# Patient Record
Sex: Female | Born: 1950 | Race: Asian | Hispanic: No | Marital: Married
Health system: Southern US, Community
[De-identification: ages and names within clinical notes are randomized; demographics above are authoritative.]

---

## 2008-07-09 ENCOUNTER — Emergency Department (HOSPITAL_BASED_OUTPATIENT_CLINIC_OR_DEPARTMENT_OTHER): Admission: EM | Admit: 2008-07-09 | Discharge: 2008-07-09 | Payer: Self-pay | Admitting: Emergency Medicine

## 2010-01-27 ENCOUNTER — Encounter: Payer: Self-pay | Admitting: Obstetrics and Gynecology

## 2010-04-14 LAB — URINALYSIS, ROUTINE W REFLEX MICROSCOPIC
Bilirubin Urine: NEGATIVE
Glucose, UA: NEGATIVE mg/dL
Ketones, ur: NEGATIVE mg/dL
Specific Gravity, Urine: 1.017 (ref 1.005–1.030)
pH: 6 (ref 5.0–8.0)

## 2010-04-14 LAB — URINE MICROSCOPIC-ADD ON

## 2015-08-22 ENCOUNTER — Emergency Department (HOSPITAL_COMMUNITY): Payer: Medicare Other

## 2015-08-22 ENCOUNTER — Emergency Department (HOSPITAL_COMMUNITY)
Admission: EM | Admit: 2015-08-22 | Discharge: 2015-08-22 | Disposition: A | Discharge status: Home / Self Care | Payer: Medicare Other | Attending: Emergency Medicine | Admitting: Emergency Medicine

## 2015-08-22 DIAGNOSIS — R079 Chest pain, unspecified: Secondary | ICD-10-CM | POA: Diagnosis present

## 2015-08-22 DIAGNOSIS — R51 Headache: Secondary | ICD-10-CM | POA: Diagnosis not present

## 2015-08-22 DIAGNOSIS — N390 Urinary tract infection, site not specified: Secondary | ICD-10-CM | POA: Diagnosis not present

## 2015-08-22 DIAGNOSIS — R519 Headache, unspecified: Secondary | ICD-10-CM

## 2015-08-22 LAB — URINALYSIS, ROUTINE W REFLEX MICROSCOPIC
Bilirubin Urine: NEGATIVE
Glucose, UA: NEGATIVE mg/dL
Hgb urine dipstick: NEGATIVE
Ketones, ur: NEGATIVE mg/dL
Nitrite: POSITIVE — AB
Protein, ur: NEGATIVE mg/dL
Specific Gravity, Urine: 1.011 (ref 1.005–1.030)
pH: 7 (ref 5.0–8.0)

## 2015-08-22 LAB — HEPATIC FUNCTION PANEL
ALT: 23 U/L (ref 14–54)
AST: 25 U/L (ref 15–41)
Albumin: 3.5 g/dL (ref 3.5–5.0)
Alkaline Phosphatase: 46 U/L (ref 38–126)
Bilirubin, Direct: 0.2 mg/dL (ref 0.1–0.5)
Indirect Bilirubin: 0.9 mg/dL (ref 0.3–0.9)
Total Bilirubin: 1.1 mg/dL (ref 0.3–1.2)
Total Protein: 6.4 g/dL — ABNORMAL LOW (ref 6.5–8.1)

## 2015-08-22 LAB — CBC
HCT: 40.1 % (ref 36.0–46.0)
Hemoglobin: 13.4 g/dL (ref 12.0–15.0)
MCH: 30.9 pg (ref 26.0–34.0)
MCHC: 33.4 g/dL (ref 30.0–36.0)
MCV: 92.4 fL (ref 78.0–100.0)
Platelets: 241 10*3/uL (ref 150–400)
RBC: 4.34 MIL/uL (ref 3.87–5.11)
RDW: 13.7 % (ref 11.5–15.5)
WBC: 6.4 10*3/uL (ref 4.0–10.5)

## 2015-08-22 LAB — URINE MICROSCOPIC-ADD ON
RBC / HPF: NONE SEEN RBC/hpf (ref 0–5)
Squamous Epithelial / LPF: NONE SEEN

## 2015-08-22 LAB — BASIC METABOLIC PANEL
Anion gap: 7 (ref 5–15)
BUN: 14 mg/dL (ref 6–20)
CO2: 21 mmol/L — ABNORMAL LOW (ref 22–32)
Calcium: 9 mg/dL (ref 8.9–10.3)
Chloride: 108 mmol/L (ref 101–111)
Creatinine, Ser: 0.77 mg/dL (ref 0.44–1.00)
GFR calc Af Amer: >60 mL/min (ref >60)
GFR calc non Af Amer: >60 mL/min (ref >60)
Glucose, Bld: 111 mg/dL — ABNORMAL HIGH (ref 65–99)
Potassium: 3.4 mmol/L — ABNORMAL LOW (ref 3.5–5.1)
Sodium: 136 mmol/L (ref 135–145)

## 2015-08-22 LAB — I-STAT TROPONIN, ED: Troponin i, poc: 0.01 ng/mL (ref 0.00–0.08)

## 2015-08-22 LAB — LIPASE, BLOOD: Lipase: 38 U/L (ref 11–51)

## 2015-08-22 MED ORDER — DIPHENHYDRAMINE HCL 50 MG/ML IJ SOLN
12.5000 mg | Freq: Once | INTRAMUSCULAR | Status: AC
  Administered 2015-08-22: 09:00:00 via INTRAVENOUS
  Filled 2015-08-22: qty 1

## 2015-08-22 MED ORDER — KETOROLAC TROMETHAMINE 15 MG/ML IJ SOLN
15.0000 mg | Freq: Once | INTRAMUSCULAR | Status: AC
  Administered 2015-08-22: 15 mg via INTRAVENOUS
  Filled 2015-08-22: qty 1

## 2015-08-22 MED ORDER — SODIUM CHLORIDE 0.9 % IV BOLUS (SEPSIS)
1000.0000 mL | Freq: Once | INTRAVENOUS | Status: AC
  Administered 2015-08-22: 1000 mL via INTRAVENOUS

## 2015-08-22 MED ORDER — CEPHALEXIN 500 MG PO CAPS: 500.0000 mg | ORAL_CAPSULE | Freq: Three times a day (TID) | ORAL | 0 refills | Status: AC

## 2015-08-22 MED ORDER — PROCHLORPERAZINE EDISYLATE 5 MG/ML IJ SOLN
5.0000 mg | Freq: Once | INTRAMUSCULAR | Status: AC
  Administered 2015-08-22: 09:00:00 via INTRAVENOUS
  Filled 2015-08-22: qty 2

## 2015-08-22 MED ORDER — DEXTROSE 5 % IV SOLN
1.0000 g | Freq: Once | INTRAVENOUS | Status: AC
  Administered 2015-08-22: 1 g via INTRAVENOUS
  Filled 2015-08-22: qty 10

## 2015-08-22 NOTE — ED Notes (Signed)
Pt taken to CT.

## 2015-08-22 NOTE — ED Triage Notes (Signed)
[facility] with chest pain, HA and weakness that reportedly started at 10pm last night. CP was central, tight and non-radiating. EMS gave 324mg  ASA and 3 NTG tabs with reported pain relief. When roomed, pt states "i have weakness all over, maybe I had a stroke". Pt very anxious, tearful, [place] speaking - son translating. Hx of MI 3 mo's ago

## 2015-08-22 NOTE — ED Provider Notes (Signed)
[facility]-EMERGENCY DEPT Provider Note   CSN: [id number] Arrival date & time: 08/22/15  [id number]     History   Chief Complaint Chief Complaint  Patient presents with  . Chest Pain    HPI Taylor Landry is a 65 y.o. female.  HPI   64yF with multiple complaints. Primarily [place]namese speaking but does understand some English and family at bedside proving translation. Primary concern seems to be headache. Onset last night around 7-8pm. Gradual onset. Denies trauma. Persistent since then. L sided and throbbing/pounding. Has headaches like this previously. Per review of records, she [facility] for HA. No appreciable exacerbating or relieving factors. No fever or chills.   She has a litany of other complaints on ROS. She endorses CP which began yesterday as well and was persistent until prior to arrival when given nitro and ASA. No respiratory complaints. She reports previously told that she had a heart attack but I cannot substantiate this on records I reviewed and she cannot recall who told her this. She denies ever having cardiac catheterization. NO unusual leg pain or swelling.   R lower back pain which radiates into RLQ. Recently diagnosed with UTI. She reports started on abx by GI provider than changes to another abx by PCP. She cannot recall abx but she says she is currently still taking them. She says her pain and urinary burning have not improved despite them though.   She feels weak all over and concerned that she may be having a stroke. She denies any focal neurological deficits.   No past medical history on file.  There are no active problems to display for this patient.   No past surgical history on file.  OB History    No data available       Home Medications    Prior to Admission medications   Medication Sig Start Date End Date Taking? Authorizing Provider  atorvastatin (LIPITOR) 40 MG tablet Take 40 mg by mouth daily.   Yes  Historical Provider, MD  losartan-hydrochlorothiazide (HYZAAR) 100-25 MG tablet Take 1 tablet by mouth daily.   Yes Historical Provider, MD  sulfamethoxazole-trimethoprim (BACTRIM DS,SEPTRA DS) 800-160 MG tablet Take 1 tablet by mouth daily.   Yes Historical Provider, MD    Family History No family history on file.  Social History Social History  Substance Use Topics  . Smoking status: Not on file  . Smokeless tobacco: Not on file  . Alcohol use Not on file     Allergies   Review of patient's allergies indicates no known allergies.   Review of Systems Review of Systems  All systems reviewed and negative, other than as noted in HPI.  Physical Exam Updated Vital Signs BP 153/86   Pulse 62   Temp 98.1 F (36.7 C) (Oral)   Resp 18   SpO2 99%   Physical Exam  Constitutional: She appears well-developed and well-nourished. No distress.  HENT:  Head: Normocephalic and atraumatic.  Eyes: Conjunctivae and EOM are normal. Pupils are equal, round, and reactive to light.  Neck: Neck supple.  No nuchal rigidity  Cardiovascular: Normal rate and regular rhythm.   No murmur heard. Pulmonary/Chest: Effort normal and breath sounds normal. No respiratory distress.  Abdominal: Soft. There is no tenderness.  Musculoskeletal: She exhibits no edema.  Neurological: She is alert. No cranial nerve deficit. She exhibits normal muscle tone. Coordination normal.  Skin: Skin is warm and dry. No rash noted.  Psychiatric: She has a normal mood and affect.  Nursing note and vitals reviewed.    ED Treatments / Results  Labs (all labs ordered are listed, but only abnormal results are displayed) Labs Reviewed  URINALYSIS, ROUTINE W REFLEX MICROSCOPIC (NOT AT [facility]) - Abnormal; Notable for the following:       Result Value   APPearance CLOUDY (*)    Nitrite POSITIVE (*)    Leukocytes, UA TRACE (*)    All other components within normal limits  HEPATIC FUNCTION PANEL - Abnormal; Notable for  the following:    Total Protein 6.4 (*)    All other components within normal limits  BASIC METABOLIC PANEL - Abnormal; Notable for the following:    Potassium 3.4 (*)    CO2 21 (*)    Glucose, Bld 111 (*)    All other components within normal limits  URINE MICROSCOPIC-ADD ON - Abnormal; Notable for the following:    Bacteria, UA MANY (*)    All other components within normal limits  URINE CULTURE  CBC  LIPASE, BLOOD  I-STAT TROPOININ, ED    EKG  EKG Interpretation  Date/Time:  Wednesday August 22 2015 06:59:16 EDT Ventricular Rate:  87 PR Interval:  166 QRS Duration: 106 QT Interval:  420 QTC Calculation: 505 R Axis:   -2 Text Interpretation:  Normal sinus rhythm Incomplete right bundle branch block Prolonged QT Abnormal ECG No old tracing to compare Confirmed by WARD,  DO, KRISTEN ([id number]) on 08/22/2015 7:24:37 AM       Radiology Dg Chest 2 View  Result Date: 08/22/2015 CLINICAL DATA:  Chest pain and dizziness EXAM: CHEST  2 VIEW COMPARISON:  None available FINDINGS: Upper limits of normal heart size. Negative mediastinal contours. EKG leads create artifact over the chest. No acute infiltrate or edema. No effusion or pneumothorax. No acute osseous findings. IMPRESSION: No active cardiopulmonary disease. Electronically Signed   By: Marnee Spring M.D.   On: 08/22/2015 08:13   Ct Head Wo Contrast  Result Date: 08/22/2015 CLINICAL DATA:  Headaches and right-sided weakness for 2 days EXAM: CT HEAD WITHOUT CONTRAST TECHNIQUE: Contiguous axial images were obtained from the base of the skull through the vertex without intravenous contrast. COMPARISON:  None. FINDINGS: Brain: No evidence of acute infarction, hemorrhage, hydrocephalus, extra-axial collection or mass lesion/mass effect. Vascular: No hyperdense vessel or unexpected calcification. Skull: Within normal limits Sinuses/Orbits: No acute finding. IMPRESSION: No acute intracranial abnormality noted. Electronically Signed   By:  Alcide Clever M.D.   On: 08/22/2015 09:11    Procedures Procedures (including critical care time)  Medications Ordered in ED Medications  ketorolac (TORADOL) 15 MG/ML injection 15 mg (not administered)  cefTRIAXone (ROCEPHIN) 1 g in dextrose 5 % 50 mL IVPB (not administered)  sodium chloride 0.9 % bolus 1,000 mL (0 mLs Intravenous Stopped 08/22/15 0940)  prochlorperazine (COMPAZINE) injection 5 mg ( Intravenous Given 08/22/15 0835)  diphenhydrAMINE (BENADRYL) injection 12.5 mg ( Intravenous Given 08/22/15 0835)     Initial Impression / Assessment and Plan / ED Course  I have reviewed the triage vital signs and the nursing notes.  Pertinent labs & imaging results that were available during my care of the patient were reviewed by me and considered in my medical decision making (see chart for details).  Clinical Course    64yF with multiple complaints. Some seem chronic to some degree. Has had headaches intermittently dating back over a year per review of records. No particualarly concerning red flags  today. Afebrile. No nuchal rigidity. No trauma. She seems generally weak on exam and I question how much effort related. No focally worse weakness. No blood thinners. No known malignancy. No visual complaints.   R mid/lower back and abdominal pain may be from UTI. Many bacteria on UA today w/o squamous cells. Nitrite +, Trace leukocytes. Unfortunately I cannot locate any culture data from recent visits to outside facilities. Will send one today. I'm also not sure what antibiotics she specifically have been on. Will give dose of rocephin in ED and DC with keflex. This could potentially explain her generalized weakness/malaise as well. She doesn't describe symptoms which would make me have a stronger suspicion for ureteral colic/infected stone.  Her CP is atypical. Now resolved. Was constant from last night up until just before arrival. EKG w/o acute appearing changes. Troponin normal. Doubt ACS, PE,  dissection or other emergent process.     Final Clinical Impressions(s) / ED Diagnoses   Final diagnoses:  Nonintractable headache, unspecified chronicity pattern, unspecified headache type  Chest pain, unspecified chest pain type  UTI (lower urinary tract infection)    New Prescriptions New Prescriptions   No medications on file     Raeford RazorStephen Elsa Ploch, MD 08/26/15 [phone]

## 2015-08-24 LAB — URINE CULTURE: Culture: >=100000 — AB

## 2015-08-25 ENCOUNTER — Telehealth (HOSPITAL_BASED_OUTPATIENT_CLINIC_OR_DEPARTMENT_OTHER): Payer: Self-pay

## 2015-08-25 NOTE — Telephone Encounter (Signed)
Post ED Visit - Positive Culture Follow-up  Culture report reviewed by antimicrobial stewardship pharmacist:  []  Taylor Landry, Pharm.D. []  Taylor Landry, Pharm.D., BCPS []  Taylor Landry, Pharm.D. []  Taylor Landry, Pharm.D., BCPS []  Taylor Landry, [place] BCPS, AAHIVP []  Taylor Landry, Pharm.D., BCPS, AAHIVP []  Taylor Landry, [address] []  Taylor Landry, [address]Ph[address] Rachel Rumsbarger Pharm D Positive urine culture Treated with Cephalexin, organism sensitive to the same and no further patient follow-up is required at this time.  Taylor Landry, Taylor Landry 08/25/2015, 11:28 AM

## 2016-06-06 DEATH — deceased

## 2017-02-17 IMAGING — DX DG CHEST 2V
2 series · 2 of 2 positions shown · non-contrast
Comparison: None available

CLINICAL DATA: Chest pain and dizziness

EXAM:
CHEST  2 VIEW

[w chest pa]
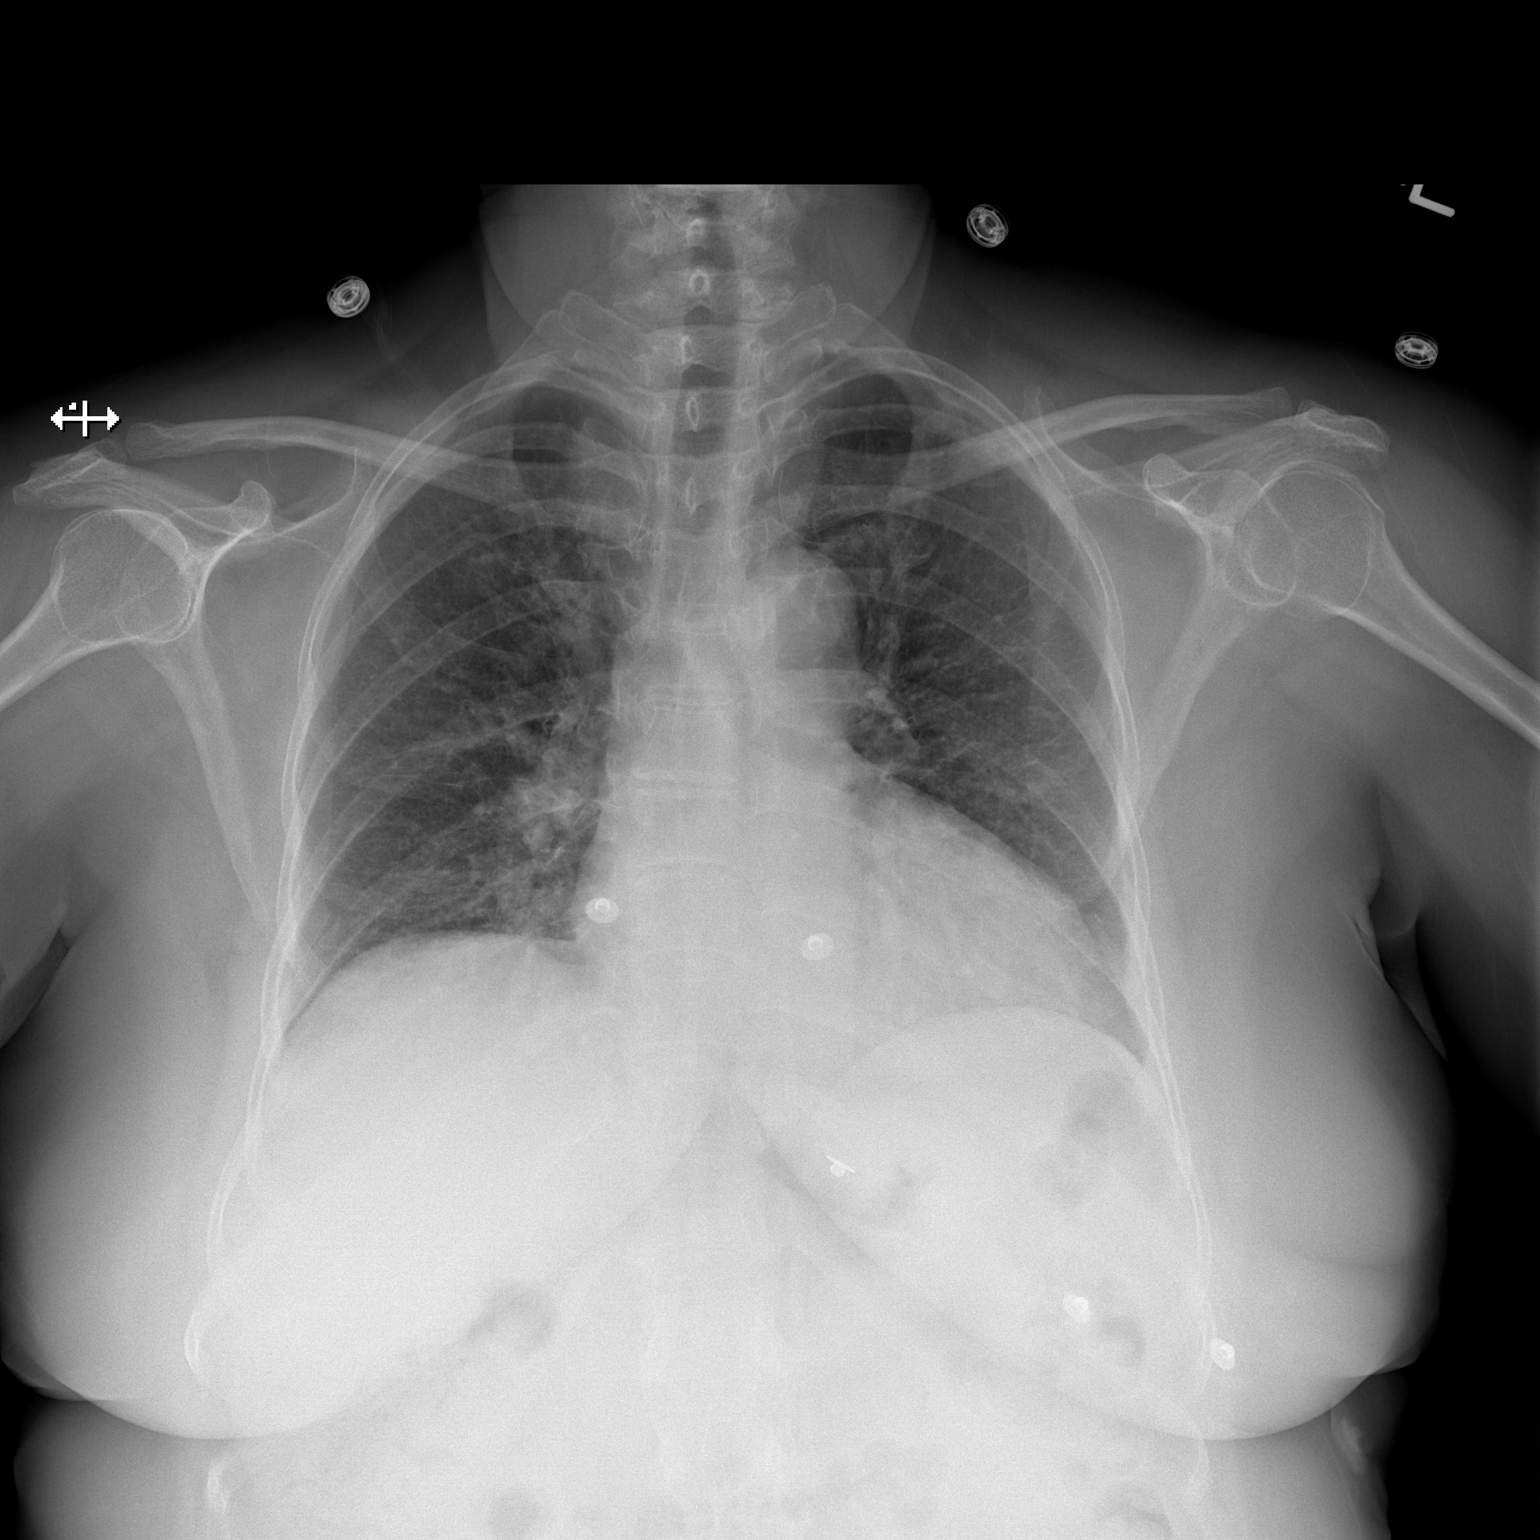

[w chest lat]
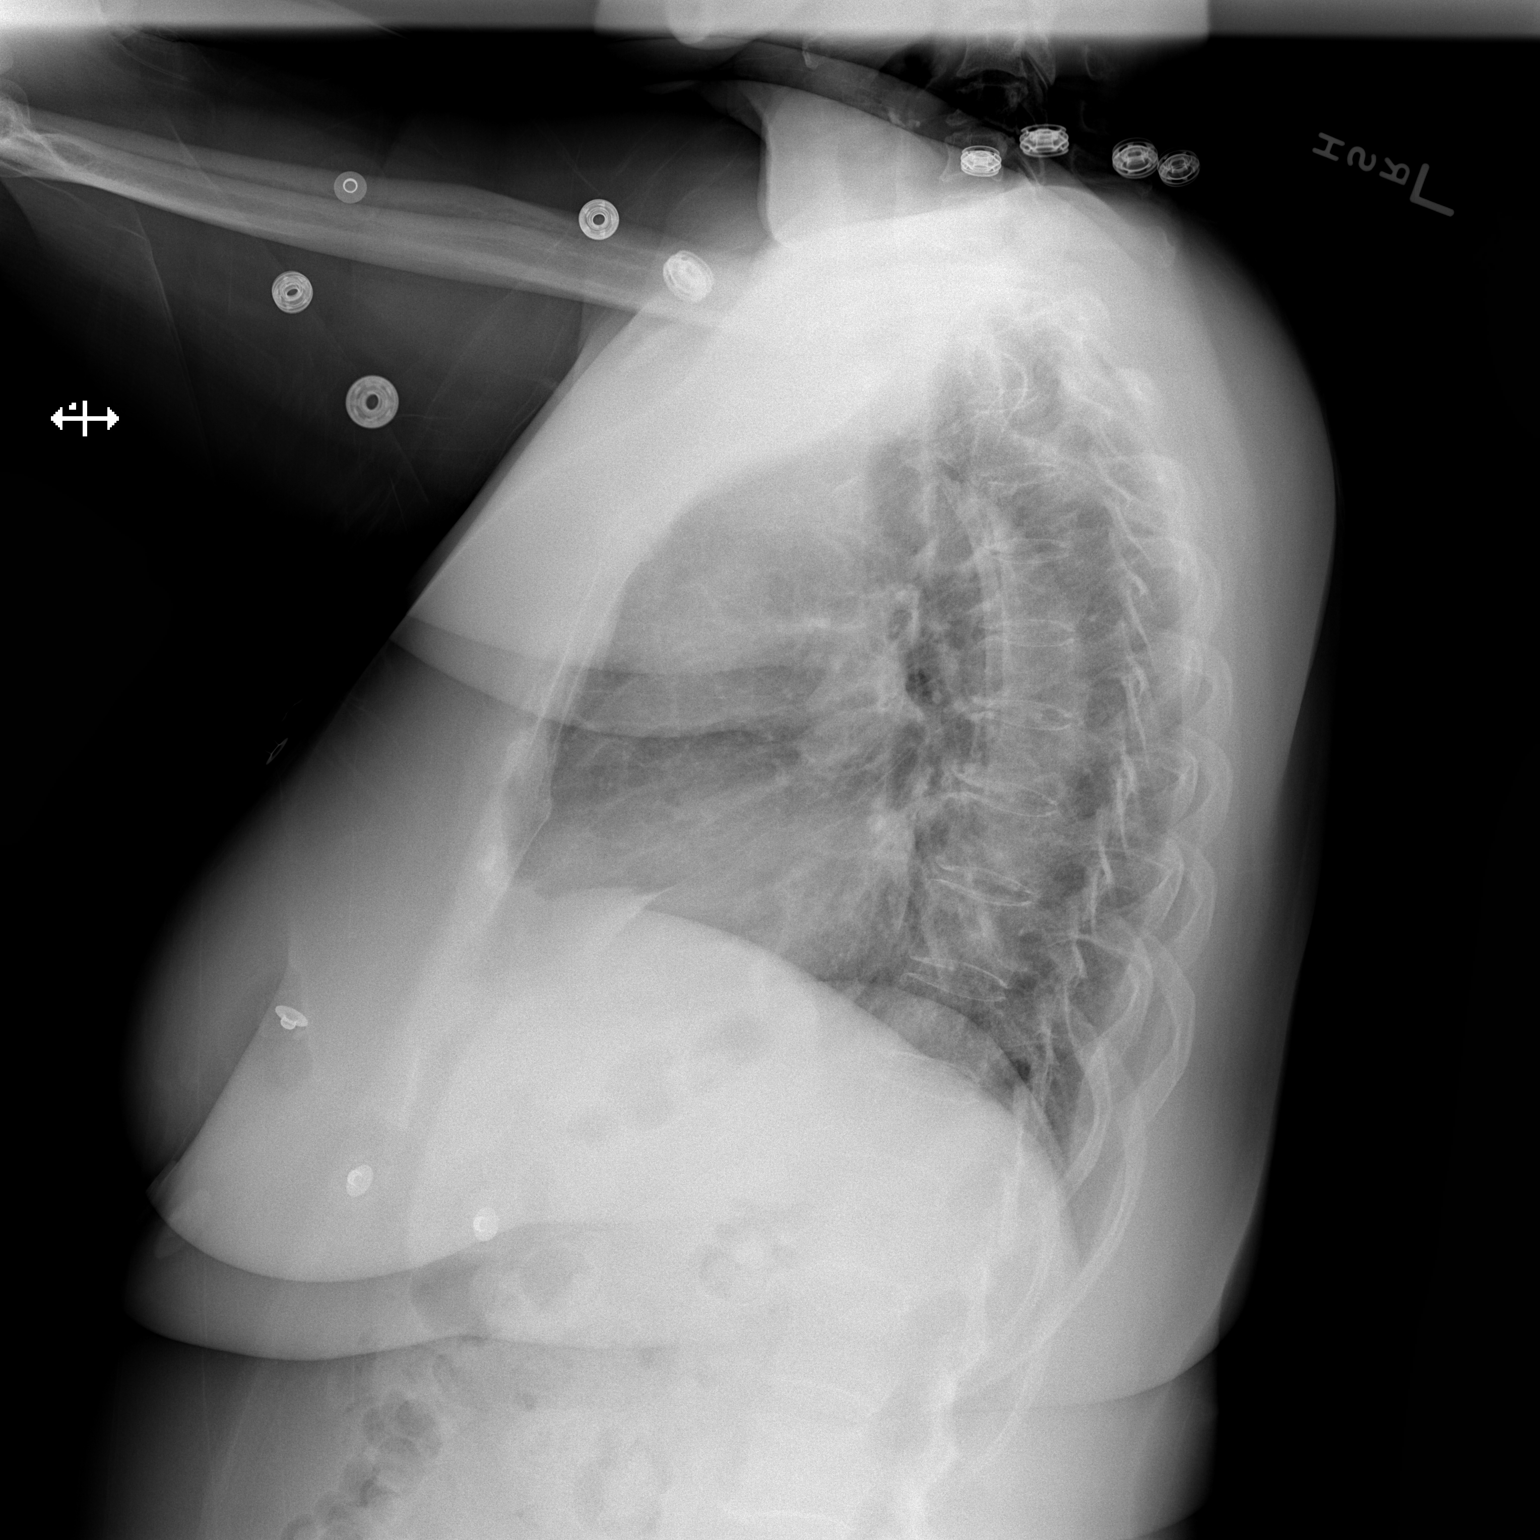

[2 of 2 positions shown; findings below may reference images not displayed]

FINDINGS: Upper limits of normal heart size. Negative mediastinal contours.
EKG leads create artifact over the chest. No acute infiltrate or
edema. No effusion or pneumothorax. No acute osseous findings.
IMPRESSION: No active cardiopulmonary disease.

## 2017-02-17 IMAGING — CT CT HEAD W/O CM
3 of 4 series · 18 of 47 positions shown, 21 images · non-contrast
Comparison: None.

CLINICAL DATA: Headaches and right-sided weakness for 2 days

EXAM:
CT HEAD WITHOUT CONTRAST
TECHNIQUE: Contiguous axial images were obtained from the base of the skull
through the vertex without intravenous contrast.

[Series 201: head w/o, idose (1) · axial · non-contrast · 0.39mm/px · z∈[+60,+180]mm · 12 of 29 slices shown, 15 images]
[im 3/29  brain]
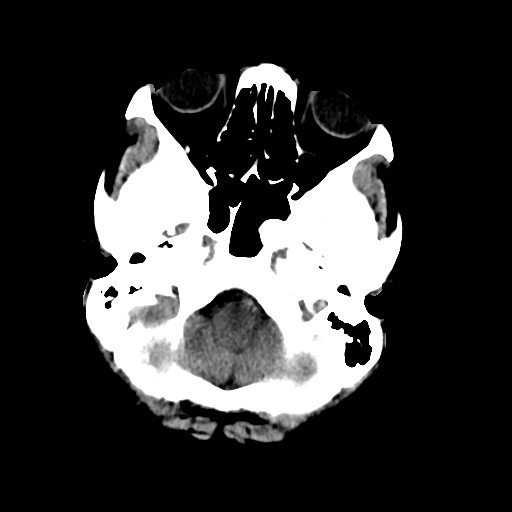
[im 3/29  bone]
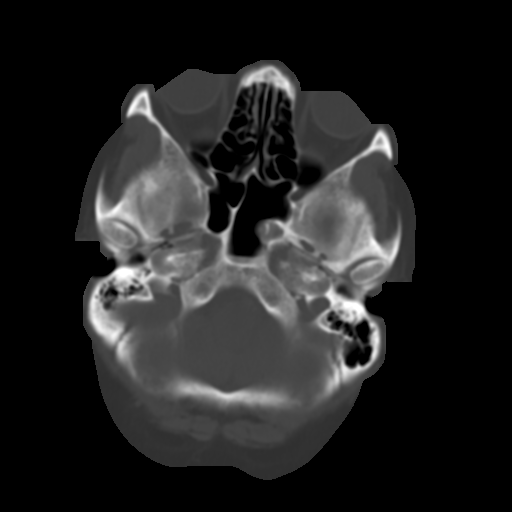
[im 5/29  brain]
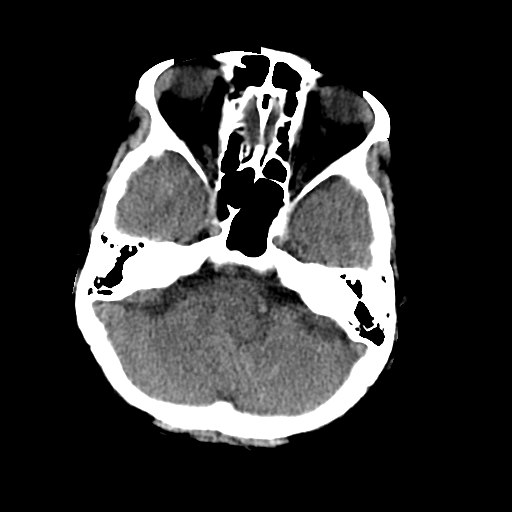
[im 7/29  brain]
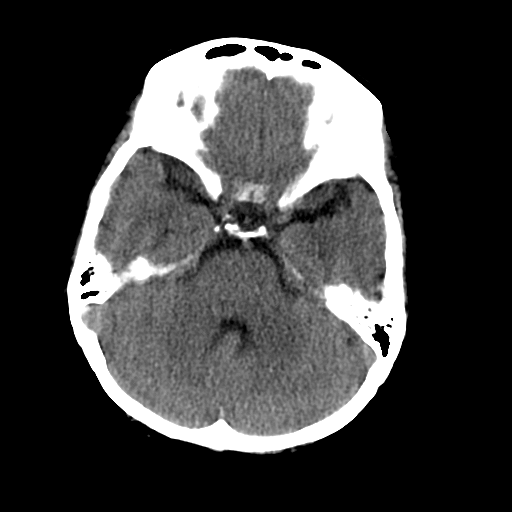
[im 9/29  brain]
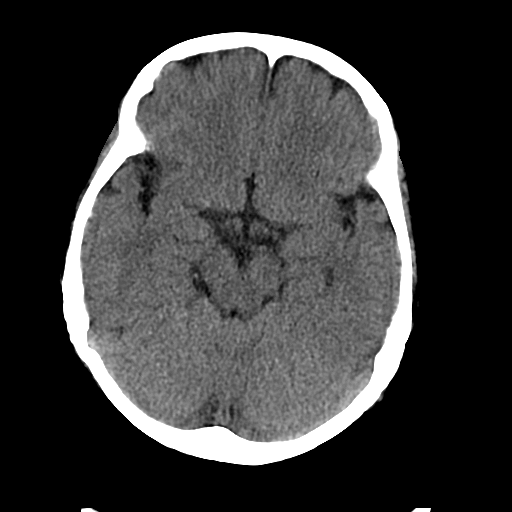
[im 11/29  brain]
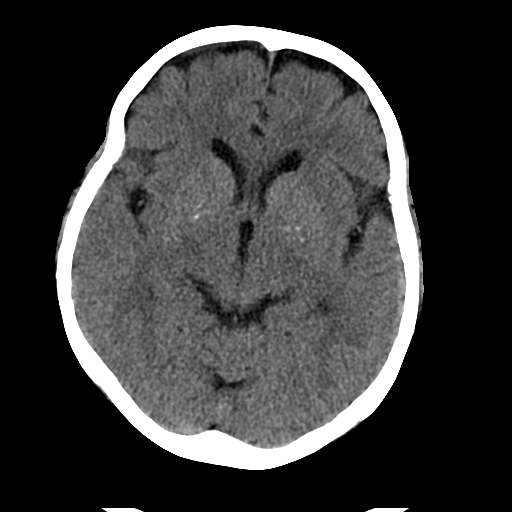
[im 11/29  bone]
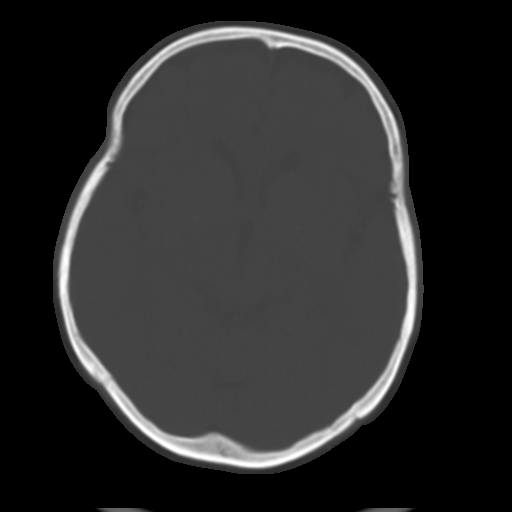
[im 13/29  brain]
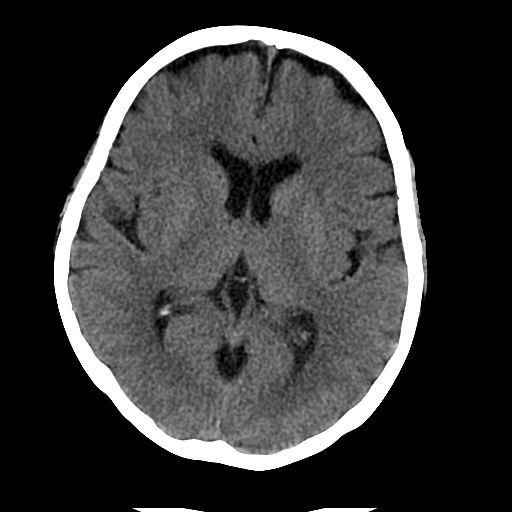
[im 17/29  brain]
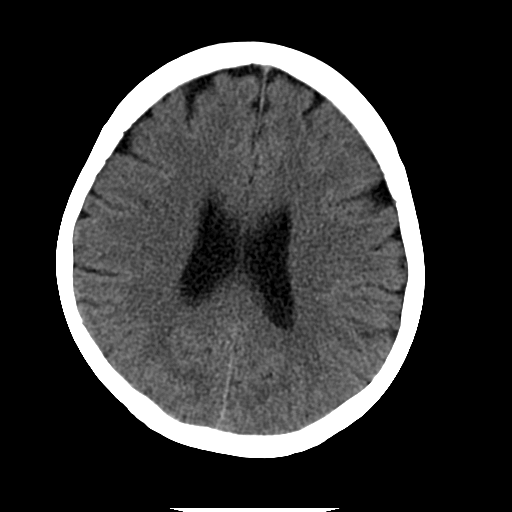
[im 19/29  brain]
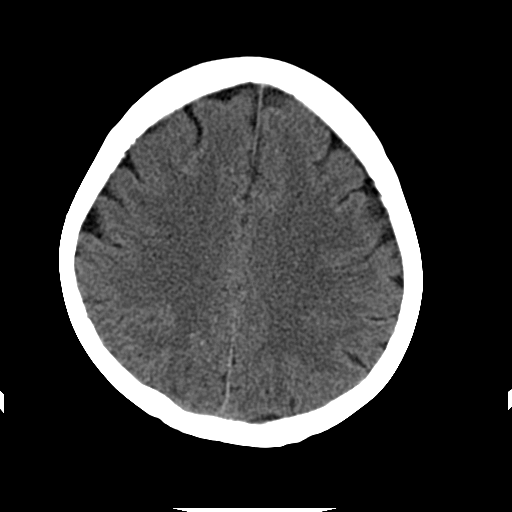
[im 21/29  brain]
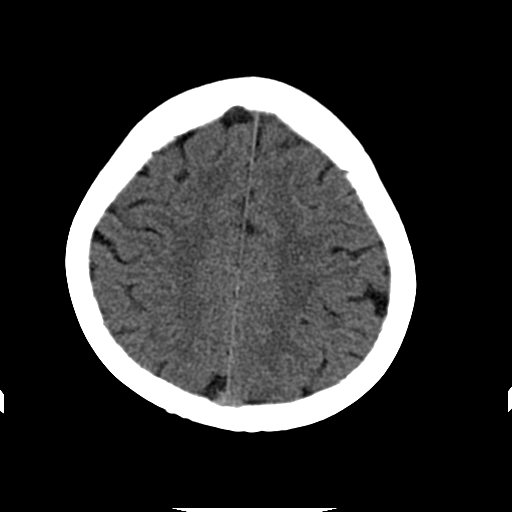
[im 21/29  bone]
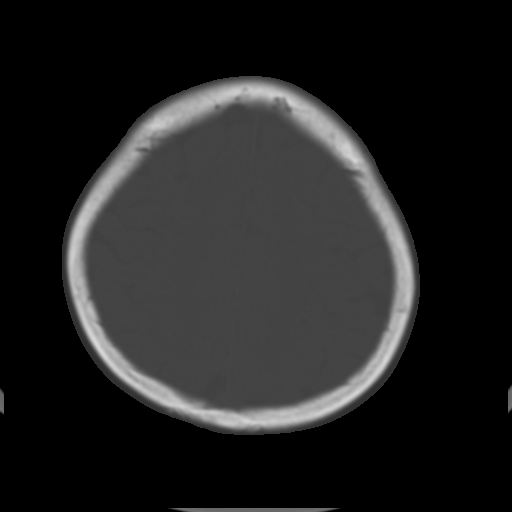
[im 23/29  brain]
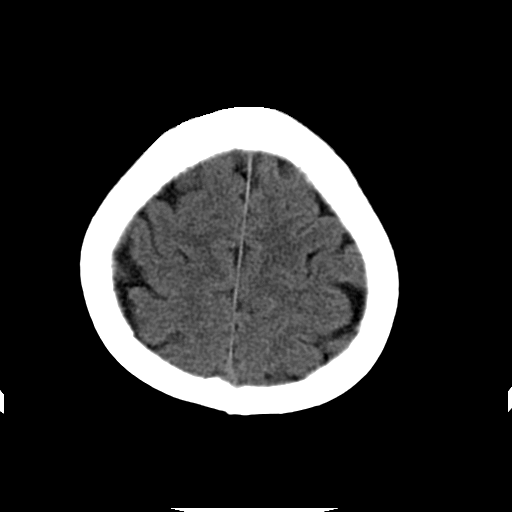
[im 25/29  brain]
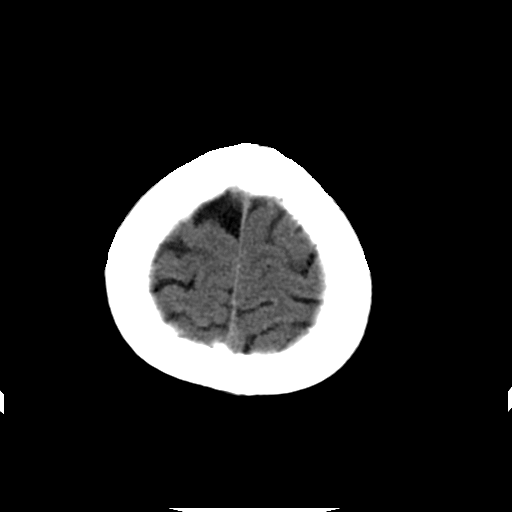
[im 27/29  brain]
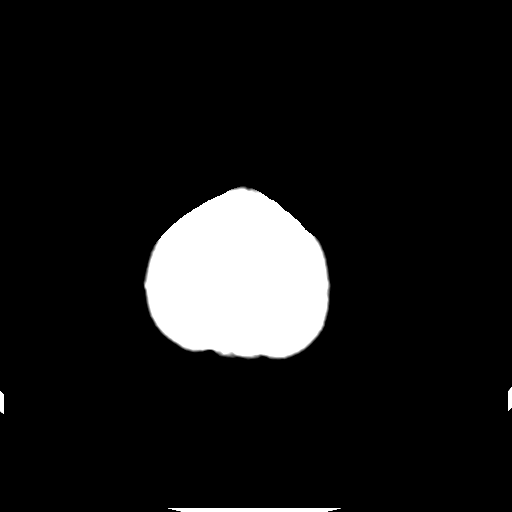

[Series 203: coronal st, idose (1) · coronal · 0.39mm/px · 3 of 64 slices shown]
[im 22/64  brain]
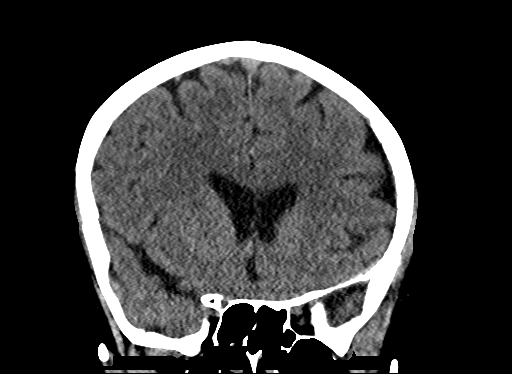
[im 29/64  brain]
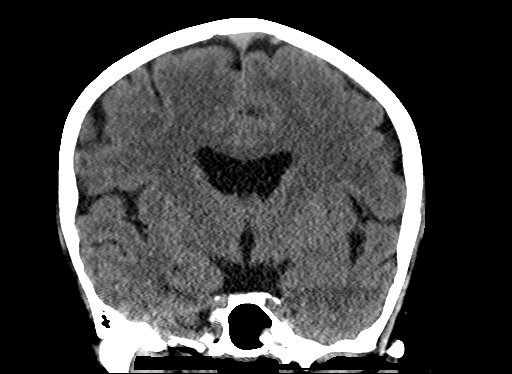
[im 36/64  brain]
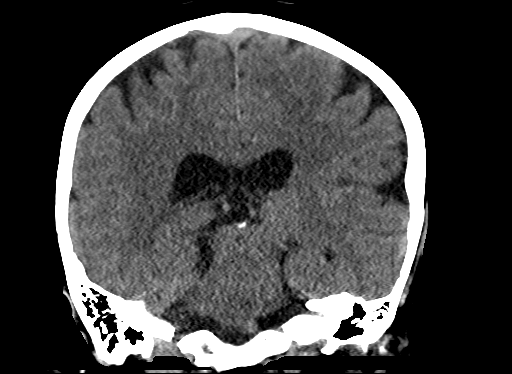

[Series 204: sagittal st, idose (1) · sagittal · 0.39mm/px · 3 of 66 slices shown]
[im 22/66  brain]
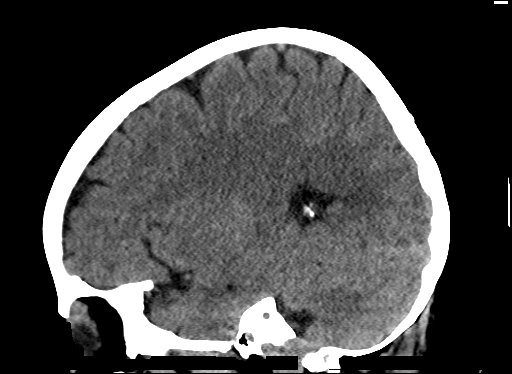
[im 33/66  brain]
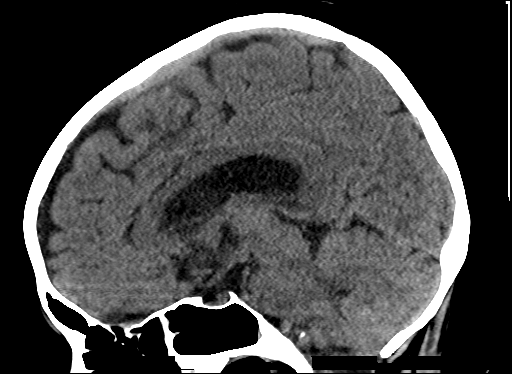
[im 44/66  brain]
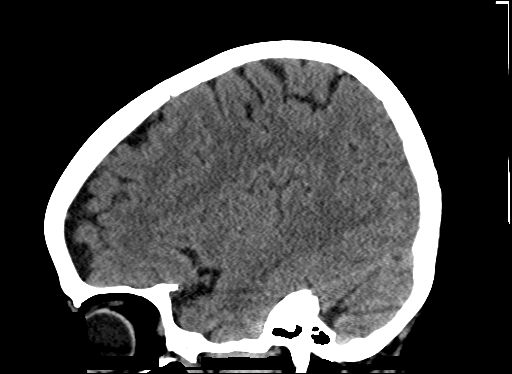

[18 of 47 positions shown; findings below may reference images not displayed]

FINDINGS: Brain: No evidence of acute infarction, hemorrhage, hydrocephalus,
extra-axial collection or mass lesion/mass effect.

Vascular: No hyperdense vessel or unexpected calcification.

Skull: Within normal limits

Sinuses/Orbits: No acute finding.
IMPRESSION: No acute intracranial abnormality noted.
